# Patient Record
Sex: Female | Born: 1960 | Race: White | Hispanic: No | Marital: Married | Smoking: Never smoker
Health system: Southern US, Community
[De-identification: ages and names within clinical notes are randomized; demographics above are authoritative.]

## PROBLEM LIST (undated history)

## (undated) DIAGNOSIS — T7840XA Allergy, unspecified, initial encounter: Secondary | ICD-10-CM

## (undated) DIAGNOSIS — N800 Endometriosis of uterus: Secondary | ICD-10-CM

## (undated) DIAGNOSIS — R519 Headache, unspecified: Secondary | ICD-10-CM

## (undated) DIAGNOSIS — G8929 Other chronic pain: Secondary | ICD-10-CM

## (undated) DIAGNOSIS — R51 Headache: Secondary | ICD-10-CM

## (undated) HISTORY — PX: WRIST SURGERY: SHX841

## (undated) HISTORY — DX: Headache, unspecified: R51.9

## (undated) HISTORY — DX: Allergy, unspecified, initial encounter: T78.40XA

## (undated) HISTORY — DX: Endometriosis of uterus: N80.0

## (undated) HISTORY — DX: Headache: R51

## (undated) HISTORY — DX: Other chronic pain: G89.29

---
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
## 1990-01-10 DIAGNOSIS — N8003 Adenomyosis of the uterus: Secondary | ICD-10-CM

## 1990-01-10 DIAGNOSIS — N800 Endometriosis of uterus: Secondary | ICD-10-CM

## 1990-01-10 HISTORY — DX: Adenomyosis of the uterus: N80.03

## 1990-01-10 HISTORY — DX: Endometriosis of uterus: N80.0

## 1990-01-10 HISTORY — PX: LAPAROSCOPIC ENDOMETRIOSIS FULGURATION: SUR769

## 1998-01-10 HISTORY — PX: MOUTH SURGERY: SHX715

## 2004-01-11 HISTORY — PX: ENDOMETRIAL ABLATION: SHX621

## 2009-01-10 HISTORY — PX: APPENDECTOMY: SHX54

## 2010-04-12 ENCOUNTER — Other Ambulatory Visit: Payer: Self-pay | Admitting: Gastroenterology

## 2010-04-13 ENCOUNTER — Ambulatory Visit
Admission: RE | Admit: 2010-04-13 | Discharge: 2010-04-13 | Disposition: A | Discharge status: Home / Self Care | Payer: 59 | Source: Ambulatory Visit | Attending: Gastroenterology | Admitting: Gastroenterology

## 2010-04-13 ENCOUNTER — Other Ambulatory Visit: Payer: Self-pay | Admitting: Gastroenterology

## 2010-04-13 DIAGNOSIS — R52 Pain, unspecified: Secondary | ICD-10-CM

## 2010-04-13 MED ORDER — IOHEXOL 300 MG/ML  SOLN
100.0000 mL | Freq: Once | INTRAMUSCULAR | Status: AC | PRN
  Administered 2010-04-13: 100 mL via INTRAVENOUS

## 2010-04-14 ENCOUNTER — Other Ambulatory Visit: Payer: Self-pay | Admitting: General Surgery

## 2010-04-14 ENCOUNTER — Observation Stay (HOSPITAL_COMMUNITY)
Admission: AD | Admit: 2010-04-14 | Discharge: 2010-04-15 | Disposition: A | Discharge status: Home / Self Care | Payer: 59 | Source: Ambulatory Visit | Attending: General Surgery | Admitting: General Surgery

## 2010-04-14 ENCOUNTER — Emergency Department (HOSPITAL_COMMUNITY): Admission: EM | Admit: 2010-04-14 | Payer: 59 | Source: Home / Self Care

## 2010-04-14 DIAGNOSIS — R1031 Right lower quadrant pain: Secondary | ICD-10-CM | POA: Insufficient documentation

## 2010-04-14 DIAGNOSIS — N805 Endometriosis of intestine, unspecified: Secondary | ICD-10-CM | POA: Insufficient documentation

## 2010-04-14 DIAGNOSIS — D126 Benign neoplasm of colon, unspecified: Principal | ICD-10-CM | POA: Insufficient documentation

## 2010-04-14 HISTORY — PX: COLON SURGERY: SHX602

## 2010-04-26 NOTE — Op Note (Signed)
[name], Carmen                ACCOUNT NO.:  [id number]  MEDICAL RECORD NO.:  [id number]           PATIENT TYPE:  O  LOCATION:  1525                         FACILITY:  [facility]  PHYSICIAN:  Anselm Pancoast. 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  OPERATION:  Appendectomy and then I did a little limited excision of the base of the appendix after pathology examination by Dr. Laureen Ochs.  HISTORY OF PRESENT ILLNESS:  Carmen Wu is a 50 year old female who was referred by Dr. Kinnie Scales for about a 2-week history of kind of progressing pain in the right lower quadrant.  No nausea or vomiting, but she saw Dr. Kinnie Scales on Monday.  Had a CT done yesterday that showed what was thought to be an inflammatory area behind the cecum with no obvious stranding and not anything that looked like a ruptured appendicitis, but certainly concerning of appendicitis.  She has a past history of endometriosis, but has had no symptoms recently.  Her last colonoscopy was approximately 2 years ago.  I discussed with her that I would recommended that we go ahead and proceed with kind of an open appendectomy and she is in agreement with that.  She is still tender in the right lower quadrant, but she is not febrile.  PROCEDURE IN DETAIL:  She was given Zosyn 3.375 grams IV.  She has PAS stockings.  She had voided before going to the OR and was taken to the OR.  Induction of general anesthesia.  Dr. Okey Dupre was the anesthesiologist.  Endotracheal tube was placed.  The abdomen was prepped with Betadine solution and I made a little small incision in the right lower quadrant below the iliac crest.  The subcutaneous tissue was opened as was Scarpa's fascia.  The external  oblique was opened in the directions of its fibers and then the lateral edge of the rectus muscle was split.  The underlying peritoneum was opened and then the underlying posterior rectus, external, internal oblique and then the posterior or the peritoneal opened.  The cecum was right under the area.  You could run your finger in and feel a little mass behind the cecum to appendiceal and a little goulet were used to kind of retraction by the scrub nurse and then I could visualize the area and it is cystic- appearing.  It looks like a little short nubble of appendix, but there is no evidence of any acute inflammation.  The area is a little bit more tissue than the inflammatory component and I mobilized the cecum so I could bring it up into the wound.  There was a little cystic structure that, with manipulating it, that popped.  We are talking about a quarter of an inch-type area and I went ahead and could visualize the base of the appendix.  The appendix itself, if it is an inflammatory process, is kind of the tip of a little short  squabby appendix.  I freed up the base from its attachment where it was laying in the retrocecal area and could free up the base, encompassed it with a 2-0 Vicryl and then tied it. The little area where the area was stuck to the posterior portion of the cecum, I kind of closed that with some Lembert sutures of 3-0 silk and then, of course, the structure had been removed.  I then put a pursestring suture on the base of the appendix.  I sent this over and let Dr. Laureen Ochs examine it and he said that this is a mucinous something or other.  He hopes it is not a malignancy, but it would take a lot of looking to be sure that it is not and that they really cannot call these things on frozen examination.  I discussed with him that I could go ahead and remove the base of the appendix, the kind of little half-inch segment of the cecum since I could bring it up and put it under  a TA-30 stapler and he said that he would recommend going ahead and doing that. I freed up the little mesentery around it and used the TA-30 with the thicker staples, fired it and then used a knife to cut it.  I then used Lembert 3-0 silk sutures to kind of turn in the suture line, making sure that the ileocecal valve area was not compromised.  The bleeding was well-controlled.  I could not feel any other nodularities or other problems and it looks like we got real good results with the enemas and she is not constipated like she was before the enemas.  The incision was closed with a running 2-0 Vicryl on the peritoneum and transversalis and internal oblique that is a little 2-inch incision and then another running layers of 2-0 Vicryl on the external oblique, a few interrupted Vicryls on the Scarpa's fascia, 4-0 Dexon and 3 half-inch Steri-Strips. I talked with the patient's husband that it is going to be 2 or 3 days before we have a final pathology report and hopefully this is not going to be a malignancy that would require a right colectomy.  The patient has not been notified of our findings yet, as she is waking up in therecovery room.  Sponge and needle counts were correct.  Estimated blood loss was minimal.  I am going to plan on giving her another dose of antibiotics postoperatively and she is spending the night.     Anselm Pancoast. Zachery Dakins, M.D.     WJW/MEDQ  D:  04/14/2010  T:  04/15/2010  Job:  [id number]  cc:   Griffith Citron, M.D. Fax: [phone]  Anselm Pancoast. Zachery Dakins, M.D. [address] [place] [address]  Electronically Signed by Consuello Bossier M.D. on 04/26/2010 08:54:40 AM

## 2010-04-26 NOTE — H&P (Signed)
Carmen Wu, Carmen Wu                ACCOUNT NO.:  [id number]  MEDICAL RECORD NO.:  [id number]           PATIENT TYPE:  O  LOCATION:  1525                         FACILITY:  [facility]  PHYSICIAN:  Anselm Pancoast. Fatemah Pourciau, M.D.DATE OF BIRTH:  1960/02/28  DATE OF ADMISSION:  04/14/2010 DATE OF DISCHARGE:                             HISTORY & PHYSICAL   CHIEF COMPLAINT:  Abdominal pain of approximately two weeks' duration right lower quadrant.  HISTORY:  Carmen Wu is a 50 year old female who was seeing Dr. Kinnie Scales for abdominal pain of approximately 10 days or so duration.  She has kind of a chronic history of poor bowel movements.  Had a colonoscopy about 2 years ago and saw him recently because of this pain, which she described as always being in the right lower quadrant.  No fever.  No significant nausea and vomiting and et Karie Soda.  He saw her and did laboratory studies, which were unremarkable and then ordered a CT.  The CT was read as showing a mass located behind the cecum that was thought to be probably a chronic appendicitis.  I called the patient yesterday and she was at work and said that she was not having any acute symptoms and et Karie Soda and she described the pain as starting about a week ago and that she would have episodes of pain in the right lower quadrant. It kind of felt like it was a significant pressure issue and then because of this she saw Dr. Kinnie Scales.  I recommended that we place her n.p.o. after midnight and let her come to the emergency room early this morning so I could examine her.  She came to the emergency room, had laboratories drawn and it was from spectrum and the hematocrit was normal as was the white count, similar to what Dr. Jennye Boroughs 2 days earlier.  I took her over to x-ray and reviewed the CT with her and showed her this little thickening located right at the cecal area and showed her how constipated she is chronically.  She, on physical examination, is  definitely still tender in the right lower quadrant, kind of with involuntary muscle guarding.  She is not tender in the other areas.  I discussed with her that I would, because of the tenderness, go ahead and admit her, give her a soapsuds enema and unless her symptoms actually subside with the enema, would consider doing an appendectomy.  Whether it is done open or laparoscopic, I think with the mass being down in the right lower quadrant and possibly and probably retrocecally, that a little open appendectomy might be the better way of managing this since she is quite thin.  She was in agreement with this, did have the enema with good results, but did not improve her pain and is in agreement for an appendectomy.  PAST MEDICAL HISTORY:  Years ago she has had a laparotomy through a Pfannenstiel incision for endometriosis.  She has also had a uterine ablation several years ago for heavy menstrual bleeding and has not had further problems with endometriosis that she is aware of.  Her colonoscopy has been  unremarkable.  She has had wrist surgeries.  She works as a Emergency planning/management officer for one of [employer] and is quite physically active.  She says she drinks a large amount of fluids, but she has always had kind of a lifelong history of poor bowel function. On the CT there was no evidence of any diverticulosis that I could see.  ALLERGIES:  She is not allergic to any chronic medications.  MEDICATIONS:  As far as chronic medications, I do not think that she takes anything of any significance.  She is on herbal supplement, multivitamins, vitamin D over-the-counter and estradiol kind of estrogen replacement.  FAMILY HISTORY:  The family history is noncontributory.  Somebody in her family has had a problem with colon cancer, but it is distant.  PHYSICAL EXAMINATION:  VITAL SIGNS:  Pulse was 74. EYES, EARS, NOSE AND THROAT:  Unremarkable. LUNGS:  Clear. CARDIAC:  Normal sinus  rhythm. ABDOMEN:  She is not tender in the upper abdomen.  She is locally tender in the right lower quadrant, sort of like an early appendicitis. GENITOURINARY/RECTAL:  I did not do a rectal and pelvic examination on her.  No urinary-type symptoms. EXTREMITIES:  No pedal edema. BACK:  No back issues.  ADMISSION IMPRESSION:  Mass, kind of retrocecal, probably kind of chronic appendicitis.  PLAN:  Plan on an open appendectomy later today.  EKG has been ordered.     Anselm Pancoast. Zachery Dakins, M.D.     WJW/MEDQ  D:  04/14/2010  T:  04/15/2010  Job:  [id number]  Electronically Signed by Consuello Bossier M.D. on 04/26/2010 08:54:28 AM

## 2010-05-25 NOTE — Discharge Summary (Signed)
[name], [name]                ACCOUNT NO.:  [id number]  MEDICAL RECORD NO.:  [id number]           PATIENT TYPE:  O  LOCATION:  1525                         FACILITY:  [facility]  PHYSICIAN:  Anselm Pancoast. Isami Mehra, M.D.DATE OF BIRTH:  04-29-1960  DATE OF ADMISSION:  04/15/2010 DATE OF DISCHARGE:  04/16/2010                              DISCHARGE SUMMARY   PROCEDURE:  She had an open appendectomy.  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  HISTORY:  Carmen Wu is a 50 year old Caucasian female who was seen by Dr. Ritta Slot in his office approximately 2 days prior to her hospitalization with complaints of kind of cramping abdominal pain and poor bowel function and 2 years ago had had a colonoscopy by Dr. Kinnie Scales and no definite abnormalities were noted.  She describes episodes of pain usually in the right lower quadrant.  She does not have fever.  She does not have significant nausea or vomiting, but she presented these symptoms to him.  He did laboratory studies and her white count was normal and scheduled a CT that was performed ordered the next day.  The radiologist called Dr. Kinnie Scales and there appears to be a thickening around the area of her appendix and could this be a chronic appendicitis of 4 or 5 days' duration.  I called the patient when he called me and she said she was at work and was not having any significant pain that was unusual and arrangements were made for her to come to the emergency room first thing the following morning and to be n.p.o. after midnight, as most likely she was going to need an appendectomy.  She returned as instructed the following morning at about 7 o'clock in an n.p.o. status and on examination she was afebrile.  She was not tender in the upper abdomen.  She was very locally tender in the right lower quadrant.  She is a very thin individual and the findings were consistent with kind of an early  appendicitis.  I reviewed the CT with the radiologist.  We could not see anything that was obviously an abscess, but the area appeared to be kind of a retrocecal and I recommended to her that we proceed on with an open appendectomy through a small incision instead of trying to do it laparoscopically.  She was in agreement with this and was taken to the operating room later that day and through a very small incision I was able to kind of rotate the cecum up and found an area of thickening about the size of my index finger, only about an inch and a half long, but there was kind of a cystic lesion in the distal portion that did not look like an obviously acute appendicitis, but there was definitely a mucinous-appearing lesion.  Fortunately this was in the afternoon and the pathologists were still available and I removed the appendix and sent it down for Dr. Laureen Ochs to review to see if he could tell us exactly what we were dealing with and he said he would rather not do a frozen.  It looks  like it is a mucinous lesion and he was wondering if I could go ahead and I recommended that we go ahead and excise just a little base of the appendix cecum where I had inverted it to see if there was any similar findings there.  Over the next 2 or 3 days the pathologists were studying this and trying to decide whether it was a mucinous neoplasm.  They did not think it was really a straightforward acute appendicitis.  She has had a history of endometriosis and there was definitely endometriosis in this periappendiceal mass.  The patient, however, the following morning was doing fine.  Her diet had been started.  Her incision looked good and she desired to be discharged since this was over Easter weekend and obviously nothing further was going to be done as far as studying the tumor and she was eating fine. She will be seen in the office the following week for a wound check.  I did not discharge her on  antibiotics and she was given about two doses of antibiotics in the perioperative.  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  There was nothing else surrounding the area that I could see and we are going to put her on chronic MiraLax as she was significantly constipated when she had the CT and obviously has a history of poor bowel function, even though she says that she does have a bowel movement daily.     Anselm Pancoast. Zachery Dakins, M.D.     WJW/MEDQ  D:  05/13/2010  T:  05/13/2010  Job:  [id number]  cc:   Anselm Pancoast. Zachery Dakins, M.D. [address], [address] [place] [address]  Griffith Citron, M.D. Fax: [phone]  Electronically Signed by Consuello Bossier M.D. on 05/25/2010 09:10:39 AM

## 2010-06-25 ENCOUNTER — Other Ambulatory Visit (INDEPENDENT_AMBULATORY_CARE_PROVIDER_SITE_OTHER): Payer: Self-pay | Admitting: General Surgery

## 2010-06-29 ENCOUNTER — Encounter (INDEPENDENT_AMBULATORY_CARE_PROVIDER_SITE_OTHER): Payer: Self-pay | Admitting: General Surgery

## 2010-06-29 DIAGNOSIS — R109 Unspecified abdominal pain: Secondary | ICD-10-CM | POA: Insufficient documentation

## 2010-06-29 DIAGNOSIS — J349 Unspecified disorder of nose and nasal sinuses: Secondary | ICD-10-CM | POA: Insufficient documentation

## 2010-06-29 DIAGNOSIS — Z973 Presence of spectacles and contact lenses: Secondary | ICD-10-CM

## 2010-06-29 DIAGNOSIS — R040 Epistaxis: Secondary | ICD-10-CM | POA: Insufficient documentation

## 2010-10-01 ENCOUNTER — Ambulatory Visit (INDEPENDENT_AMBULATORY_CARE_PROVIDER_SITE_OTHER): Payer: 59 | Admitting: Internal Medicine

## 2010-10-01 ENCOUNTER — Encounter: Payer: Self-pay | Admitting: Internal Medicine

## 2010-10-01 DIAGNOSIS — R87619 Unspecified abnormal cytological findings in specimens from cervix uteri: Secondary | ICD-10-CM

## 2010-10-01 DIAGNOSIS — E162 Hypoglycemia, unspecified: Secondary | ICD-10-CM

## 2010-10-01 DIAGNOSIS — Z13220 Encounter for screening for lipoid disorders: Secondary | ICD-10-CM

## 2010-10-01 DIAGNOSIS — M899 Disorder of bone, unspecified: Secondary | ICD-10-CM

## 2010-10-01 DIAGNOSIS — R5381 Other malaise: Secondary | ICD-10-CM

## 2010-10-01 DIAGNOSIS — R5383 Other fatigue: Secondary | ICD-10-CM

## 2010-10-01 DIAGNOSIS — M8580 Other specified disorders of bone density and structure, unspecified site: Secondary | ICD-10-CM | POA: Insufficient documentation

## 2010-10-01 DIAGNOSIS — M949 Disorder of cartilage, unspecified: Secondary | ICD-10-CM

## 2010-10-01 NOTE — Patient Instructions (Signed)
Your goal is 1200 mg calcium and 1000 units of Vit D daily.  Gaol is 25 minutes of weight bearing exercise 5 days/week

## 2010-10-01 NOTE — Progress Notes (Signed)
  Subjective:    Patient ID: Carmen Wu, female    DOB: 03-20-1960, 50 y.o.   MRN: [id number]  HPI  50 yo white female with complicated surgical history , medical history includes recurrent headaches, presents for establishment of primary care.  No chief complaint today.     Review of Systems  Constitutional: Negative for fever, chills and unexpected weight change.  HENT: Negative for hearing loss, ear pain, nosebleeds, congestion, sore throat, facial swelling, rhinorrhea, sneezing, mouth sores, trouble swallowing, neck pain, neck stiffness, voice change, postnasal drip, sinus pressure, tinnitus and ear discharge.   Eyes: Negative for pain, discharge, redness and visual disturbance.  Respiratory: Negative for cough, chest tightness, shortness of breath, wheezing and stridor.   Cardiovascular: Negative for chest pain, palpitations and leg swelling.  Musculoskeletal: Negative for myalgias and arthralgias.  Skin: Negative for color change and rash.  Neurological: Negative for dizziness, weakness, light-headedness and headaches.  Hematological: Negative for adenopathy.       Objective:   Physical Exam  Constitutional: She is oriented to person, place, and time. She appears well-developed and well-nourished.  HENT:  Mouth/Throat: Oropharynx is clear and moist.  Eyes: EOM are normal. Pupils are equal, round, and reactive to light. No scleral icterus.  Neck: Normal range of motion. Neck supple. No JVD present. No thyromegaly present.  Cardiovascular: Normal rate, regular rhythm, normal heart sounds and intact distal pulses.   Pulmonary/Chest: Effort normal and breath sounds normal.  Abdominal: Soft. Bowel sounds are normal. She exhibits no mass. There is no tenderness.  Musculoskeletal: Normal range of motion. She exhibits no edema.  Lymphadenopathy:    She has no cervical adenopathy.  Neurological: She is alert and oriented to person, place, and time.  Skin: Skin is warm and dry.    Psychiatric: She has a normal mood and affect.          Assessment & Plan:

## 2010-10-03 ENCOUNTER — Encounter: Payer: Self-pay | Admitting: Internal Medicine

## 2010-10-03 DIAGNOSIS — N8003 Adenomyosis of the uterus: Secondary | ICD-10-CM | POA: Insufficient documentation

## 2010-10-03 DIAGNOSIS — G8929 Other chronic pain: Secondary | ICD-10-CM | POA: Insufficient documentation

## 2010-10-03 DIAGNOSIS — N800 Endometriosis of uterus: Secondary | ICD-10-CM | POA: Insufficient documentation

## 2010-10-03 NOTE — Assessment & Plan Note (Addendum)
Her T scores have been stable per patient.  Records requested.  Reviewed calcium and vitamin d requirements and need for daily weight bearing exercise.

## 2010-10-03 NOTE — Assessment & Plan Note (Signed)
Per patient.  No documentation of true hypoglycemia (cbgs < 60).  Discussed diet,  Need to combine sugars with protein.  Will screen for diabetes.

## 2010-10-27 ENCOUNTER — Ambulatory Visit
Admission: RE | Admit: 2010-10-27 | Discharge: 2010-10-27 | Disposition: A | Discharge status: Home / Self Care | Payer: 59 | Source: Ambulatory Visit | Attending: General Surgery | Admitting: General Surgery

## 2010-10-27 MED ORDER — IOHEXOL 300 MG/ML  SOLN
100.0000 mL | Freq: Once | INTRAMUSCULAR | Status: AC | PRN
  Administered 2010-10-27: 100 mL via INTRAVENOUS

## 2010-11-17 ENCOUNTER — Telehealth (INDEPENDENT_AMBULATORY_CARE_PROVIDER_SITE_OTHER): Payer: Self-pay

## 2010-11-17 NOTE — Telephone Encounter (Signed)
Ct results given to patient also copy mailed. Patient will follow up with primary care provider. She will  Call Dr. Zachery Dakins if the need should arise.

## 2010-12-17 ENCOUNTER — Ambulatory Visit (INDEPENDENT_AMBULATORY_CARE_PROVIDER_SITE_OTHER): Payer: 59 | Admitting: General Surgery

## 2010-12-17 ENCOUNTER — Encounter (INDEPENDENT_AMBULATORY_CARE_PROVIDER_SITE_OTHER): Payer: Self-pay | Admitting: General Surgery

## 2010-12-17 VITALS — BP 120/70 | HR 62 | Temp 97.2°F | Resp 14 | Ht 65.75 in | Wt 134.8 lb

## 2010-12-17 DIAGNOSIS — K388 Other specified diseases of appendix: Secondary | ICD-10-CM

## 2010-12-17 DIAGNOSIS — K5904 Chronic idiopathic constipation: Secondary | ICD-10-CM

## 2010-12-17 DIAGNOSIS — K389 Disease of appendix, unspecified: Secondary | ICD-10-CM

## 2010-12-17 DIAGNOSIS — K5909 Other constipation: Secondary | ICD-10-CM

## 2010-12-17 NOTE — Patient Instructions (Signed)
Continue take a MiraLax and trying to keep your colon functionregular. If you continue to have nausea after eating a gall bladder ultrasound may be needed Continue regular appointment with Dr. Jennette Kettle. One of my partners could see you if you desire

## 2010-12-17 NOTE — Progress Notes (Signed)
Subjective:     Patient ID: Carmen Wu, female   DOB: Nov 24, 1960, 50 y.o.   MRN: [id number]  HPIMrs. Winfield Wu returns now for proximal 8 month following an open appendectomy that I performed after she had seen Dr. Kinnie Scales with right-sided abdominal pain of several weeks duration. He didn't say today and it showed a lesion about 3 cm behind her cecum that was thought to be a chronically inflamed appendix and since it was retrocecal and she's. Then I recommended that we do an open appendectomy and did send it for frozen exam and Dr. Darlyn Chamber the pathologist thought that this was a mucus neoplasm and while I was doing the appendectomy I did today for excision of the base of the cecum but there was no evidence of any tumor or abnormality in this portion of the colon. The patient is aware that this is a low-grade malignant potential mucinous  neoplasm and I have suggested that we do a followup CT at this time which was done recently and is showing no evidence of any abnormalities in the area around the cecum. She has had a lifelong history of chronic constipation and had originally suggested that she take MiraLax on a daily basis she says she didn't her bowels work better than the overhead she's been trying to get off of that but states that this is  causing some problems in regularity and she has resumed the miralax.        She has had her routine physical exam yearly with Dr. Jennette Kettle and he found no abnormalities on pelvic exam her CT shows that she does have a fibroid but there is no area of questionable areas within the pelvic   Review of Systems   Current Outpatient Prescriptions  Medication Sig Dispense Refill  . BIOTIN 5000 PO Take 1 tablet by mouth daily.        . calcium-vitamin D 250-100 MG-UNIT per tablet Take 1 tablet by mouth 2 (two) times daily. Ask patient to clarify dosage.       . drospirenone-ethinyl estradiol (YAZ) 3-0.02 MG per tablet Take 1 tablet by mouth daily. Ask patient to clarify dosage.        . minoxidil (ROGAINE) 2 % external solution Apply topically 2 (two) times daily.        . Multiple Vitamin (MULTIVITAMIN) capsule Take 1 capsule by mouth daily. Ask patient to clarify dosage. Also ask patient about herbal supplement Quercetin per medical history form. Not listed in database.       . polyethylene glycol (MIRALAX / GLYCOLAX) packet Take 17 g by mouth daily.        Marland Kitchen QUERCETIN PO Take 1 tablet by mouth daily.        Marland Kitchen tretinoin (RETIN-A) 0.025 % cream Apply topically once a week.         Past Surgical History  Procedure Date  . Tonsillectomy and adenoidectomy 1966  . Wrist surgery 1999 (left ) & 2003 (right)  . Laparoscopic endometriosis fulguration 1992    Confirm procedure type with patient.  . Endometrial ablation 2006  . Mouth surgery 2000    Confirm type of surgery with patient.  . Sublingual cyst excision 1968  . Appendectomy 2011  . Colon surgery 04/14/2010    Segmental resection, portion of cecum and appendix base.   No Known Allergies      Objective:   Physical Exam BP 120/70  Pulse 62  Temp(Src) 97.2 F (36.2 C) (Temporal)  Resp  14  Ht 5' 5.75" (1.67 m)  Wt 134 lb 12.8 oz (61.145 kg)  BMI 21.92 kg/m2  On abdominal exam she's got a very flat soft abdomen normal bowel sounds and a well-healed small appendectomy incision in the right lower quadrant U. contrast and the anterior right lower abdomen there's no question of any fullness and on rectal examination and examination through her posterior vagina and a soft movable movable and no evidence of any questionable masses in this area. She has had a recent colonoscopy about 2 years ago by Dr. Kinnie Scales and I think her next colonoscopy is scheduled in approximately 3 years.    Assessment:    Thank the patient is 8 months following an extended appendectomy for a low-grade mucinous of the appendix. She has a chronic history of constipation but a recent CT of her abdomen and pelvis shows no evidence of any  questionable lesions in the lower abdomen and I would not continue with frequent CTs but I would think that a careful pelvic and abdominal exam when she has her routine physical exam he is need. At present she is having some postprandial upper abdominal pain that couldn't be gallbladder in and if that persists and she will need an ultrasound of the gallbladder. She's remove her primary care to Dr. Darrick Huntsman with a [facility] in [place]. Dr. Laureen Ochs pathologist felt this has a very low chance of recurrence but he is definitely a very low grade malignancy and not a benign lesion. The patient states that she was having vague symptoms in the right lower quadrant for proximal one over more years prior to the 2-3 day episode of pain that was getting worse when she saw Dr. Kinnie Scales     Plan:     Patient is not scheduled to be seen by one of my partner that if she is having increasing symptoms they can see her if needed. The patient has copies of her recent CT scan and a copy of her pathology report.   The patient has a future ordered CBC C. met thyroid hormone and lipid panel but I can see exactly when this is to be performed. She does have a followup with Dr. Darrick Huntsman and I expect the labs are due to prior to that visit

## 2011-02-15 ENCOUNTER — Encounter: Payer: Self-pay | Admitting: Internal Medicine

## 2011-02-15 ENCOUNTER — Ambulatory Visit (INDEPENDENT_AMBULATORY_CARE_PROVIDER_SITE_OTHER): Payer: 59 | Admitting: Internal Medicine

## 2011-02-15 DIAGNOSIS — R51 Headache: Secondary | ICD-10-CM

## 2011-02-15 DIAGNOSIS — R519 Headache, unspecified: Secondary | ICD-10-CM | POA: Insufficient documentation

## 2011-02-15 MED ORDER — AMOXICILLIN-POT CLAVULANATE 875-125 MG PO TABS: 1.0000 | ORAL_TABLET | Freq: Two times a day (BID) | ORAL | Status: AC

## 2011-02-15 MED ORDER — PREDNISONE (PAK) 10 MG PO TABS: ORAL_TABLET | ORAL | Status: AC

## 2011-02-15 NOTE — Patient Instructions (Addendum)
We are going to to treat your for a sinus infection for 10 days to see if symptoms resolve with:  augmentin twice daily for 10 days Prednisone taper x 6 days  Mucinex D in the am,  Afrin nasal spray at bedtime  Please use Simply Saline nasal spray twice daily to hydrate and flush your sinuses  If no improvement,  Call and we will schedule an MRI of brain

## 2011-02-15 NOTE — Assessment & Plan Note (Signed)
Given her improvement with Sudafed and the appearance of her ears, will treat for chronic sinusitis with a 10 day course of Augmentin and prednisone. If there is no improvement in her headache better she will need an MRI as this has not been done in the past and this is a change in her headache pattern.

## 2011-02-15 NOTE — Progress Notes (Signed)
Subjective:    Patient ID: Carmen Wu, female    DOB: November 17, 1960, 51 y.o.   MRN: [id number]  HPI  Ms. weed is a 51 year old white female with a history of chronic headaches who presents today with a history of 2 solid months of daily headaches. She states the headaches have improved transiently with use of Mucinex D. and Zyrtec D. She is also noted some improvement with Excedrin Migraine. Her pain is located behind the right eye and are throbbing in nature. She has noted some sinus congestion and drainage which is clear to yellow in quality. She has had no vision changes or vertigo. No morning nausea. The headaches are not present when she wakes up .  Past Medical History  Diagnosis Date  . Allergy     now managed with daily Quercetin with bromelade  . Endometriosis of myometrium 1992  . Chronic headaches     Current Outpatient Prescriptions on File Prior to Visit  Medication Sig Dispense Refill  . BIOTIN 5000 PO Take 1 tablet by mouth daily.        . calcium-vitamin D 250-100 MG-UNIT per tablet Take 1 tablet by mouth 2 (two) times daily. Ask patient to clarify dosage.       . drospirenone-ethinyl estradiol (YAZ) 3-0.02 MG per tablet Take 1 tablet by mouth daily. Ask patient to clarify dosage.       . minoxidil (ROGAINE) 2 % external solution Apply topically 2 (two) times daily.        . Multiple Vitamin (MULTIVITAMIN) capsule Take 1 capsule by mouth daily. Ask patient to clarify dosage. Also ask patient about herbal supplement Quercetin per medical history form. Not listed in database.       . polyethylene glycol (MIRALAX / GLYCOLAX) packet Take 17 g by mouth daily.        Marland Kitchen QUERCETIN PO Take 1 tablet by mouth daily.        Marland Kitchen tretinoin (RETIN-A) 0.025 % cream Apply topically once a week.          Review of Systems  Constitutional: Negative for fever, chills and unexpected weight change.  HENT: Negative for hearing loss, ear pain, nosebleeds, congestion, sore throat, facial swelling,  rhinorrhea, sneezing, mouth sores, trouble swallowing, neck pain, neck stiffness, voice change, postnasal drip, sinus pressure, tinnitus and ear discharge.   Eyes: Negative for pain, discharge, redness and visual disturbance.  Respiratory: Negative for cough, chest tightness, shortness of breath, wheezing and stridor.   Cardiovascular: Negative for chest pain, palpitations and leg swelling.  Musculoskeletal: Negative for myalgias and arthralgias.  Skin: Negative for color change and rash.  Neurological: Negative for dizziness, weakness, light-headedness and headaches.  Hematological: Negative for adenopathy.   BP 114/68  Pulse 84  Temp(Src) 98.2 F (36.8 C) (Oral)  Wt 134 lb (60.782 kg)  SpO2 98%     Objective:   Physical Exam  Constitutional: She is oriented to person, place, and time. She appears well-developed and well-nourished.  HENT:  Right Ear: There is swelling.  Left Ear: There is swelling. Tympanic membrane is bulging.  Mouth/Throat: Oropharynx is clear and moist.  Eyes: EOM are normal. Pupils are equal, round, and reactive to light. No scleral icterus.  Neck: Normal range of motion. Neck supple. No JVD present. No thyromegaly present.  Cardiovascular: Normal rate, regular rhythm, normal heart sounds and intact distal pulses.   Pulmonary/Chest: Effort normal and breath sounds normal.  Abdominal: Soft. Bowel sounds are normal. She exhibits no  mass. There is no tenderness.  Musculoskeletal: Normal range of motion. She exhibits no edema.  Lymphadenopathy:    She has no cervical adenopathy.  Neurological: She is alert and oriented to person, place, and time.  Skin: Skin is warm and dry.  Psychiatric: She has a normal mood and affect.       Assessment & Plan:   Headache behind the eyes Given her improvement with Sudafed and the appearance of her ears, will treat for chronic sinusitis with a 10 day course of Augmentin and prednisone. If there is no improvement in her  headache better she will need an MRI as this has not been done in the past and this is a change in her headache pattern.    Updated Medication List Outpatient Encounter Prescriptions as of 02/15/2011  Medication Sig Dispense Refill  . BIOTIN 5000 PO Take 1 tablet by mouth daily.        . calcium-vitamin D 250-100 MG-UNIT per tablet Take 1 tablet by mouth 2 (two) times daily. Ask patient to clarify dosage.       . drospirenone-ethinyl estradiol (YAZ) 3-0.02 MG per tablet Take 1 tablet by mouth daily. Ask patient to clarify dosage.       . minoxidil (ROGAINE) 2 % external solution Apply topically 2 (two) times daily.        . Multiple Vitamin (MULTIVITAMIN) capsule Take 1 capsule by mouth daily. Ask patient to clarify dosage. Also ask patient about herbal supplement Quercetin per medical history form. Not listed in database.       . polyethylene glycol (MIRALAX / GLYCOLAX) packet Take 17 g by mouth daily.        Marland Kitchen QUERCETIN PO Take 1 tablet by mouth daily.        Marland Kitchen tretinoin (RETIN-A) 0.025 % cream Apply topically once a week.        Marland Kitchen amoxicillin-clavulanate (AUGMENTIN) 875-125 MG per tablet Take 1 tablet by mouth 2 (two) times daily.  20 tablet  0  . predniSONE (STERAPRED UNI-PAK) 10 MG tablet 6 tablets on day 1, decrease by tablet daily until gone  21 tablet  0

## 2011-02-18 ENCOUNTER — Other Ambulatory Visit (INDEPENDENT_AMBULATORY_CARE_PROVIDER_SITE_OTHER): Payer: 59 | Admitting: *Deleted

## 2011-02-18 DIAGNOSIS — R5383 Other fatigue: Secondary | ICD-10-CM

## 2011-02-18 DIAGNOSIS — M8580 Other specified disorders of bone density and structure, unspecified site: Secondary | ICD-10-CM

## 2011-02-18 DIAGNOSIS — M949 Disorder of cartilage, unspecified: Secondary | ICD-10-CM

## 2011-02-18 DIAGNOSIS — R5381 Other malaise: Secondary | ICD-10-CM

## 2011-02-18 DIAGNOSIS — Z13220 Encounter for screening for lipoid disorders: Secondary | ICD-10-CM

## 2011-02-18 DIAGNOSIS — E162 Hypoglycemia, unspecified: Secondary | ICD-10-CM

## 2011-02-18 DIAGNOSIS — M899 Disorder of bone, unspecified: Secondary | ICD-10-CM

## 2011-02-18 LAB — COMPREHENSIVE METABOLIC PANEL
ALT: 27 U/L (ref 0–35)
AST: 21 U/L (ref 0–37)
Calcium: 9.4 mg/dL (ref 8.4–10.5)
Chloride: 106 mEq/L (ref 96–112)
Creatinine, Ser: 0.7 mg/dL (ref 0.4–1.2)
Potassium: 4.9 mEq/L (ref 3.5–5.1)

## 2011-02-18 LAB — CBC WITH DIFFERENTIAL/PLATELET
Basophils Absolute: 0 10*3/uL (ref 0.0–0.1)
Eosinophils Absolute: 0 10*3/uL (ref 0.0–0.7)
HCT: 35.3 % — ABNORMAL LOW (ref 36.0–46.0)
Hemoglobin: 11.8 g/dL — ABNORMAL LOW (ref 12.0–15.0)
Lymphocytes Relative: 17.2 % (ref 12.0–46.0)
Lymphs Abs: 1.7 10*3/uL (ref 0.7–4.0)
MCHC: 33.2 g/dL (ref 30.0–36.0)
Monocytes Absolute: 1 10*3/uL (ref 0.1–1.0)
Neutro Abs: 7.3 10*3/uL (ref 1.4–7.7)
RDW: 13.3 % (ref 11.5–14.6)

## 2011-02-18 LAB — LIPID PANEL
LDL Cholesterol: 103 mg/dL — ABNORMAL HIGH (ref 0–99)
Total CHOL/HDL Ratio: 3
Triglycerides: 67 mg/dL (ref 0.0–149.0)

## 2011-02-24 ENCOUNTER — Telehealth: Payer: Self-pay | Admitting: *Deleted

## 2011-02-24 ENCOUNTER — Encounter: Payer: Self-pay | Admitting: Internal Medicine

## 2011-02-24 NOTE — Telephone Encounter (Signed)
Pt states she has that started last night, a fine red rash with slight itching.  She's taking amox and has finished the prednisone.  Sinus symptoms are better and her headaches are gone.  She is due to finish amox on Saturday.

## 2011-02-24 NOTE — Telephone Encounter (Signed)
See phone note

## 2011-02-24 NOTE — Telephone Encounter (Signed)
Tell; her to stop the amoxicillin and i will add that as an allergy. If her sympotms are resovled she does not need another abx

## 2011-02-24 NOTE — Telephone Encounter (Signed)
Advised patient

## 2011-02-24 NOTE — Telephone Encounter (Signed)
Left message for patient to call back  

## 2011-02-25 NOTE — Telephone Encounter (Signed)
Patient was instructed to stop the amoxicillin as this reactions represents an allergy to PCN.

## 2011-03-02 ENCOUNTER — Ambulatory Visit: Payer: 59 | Admitting: Internal Medicine

## 2011-11-21 ENCOUNTER — Encounter: Payer: Self-pay | Admitting: Internal Medicine

## 2011-11-21 ENCOUNTER — Ambulatory Visit (INDEPENDENT_AMBULATORY_CARE_PROVIDER_SITE_OTHER): Payer: 59 | Admitting: Internal Medicine

## 2011-11-21 VITALS — BP 104/60 | HR 93 | Temp 98.4°F | Ht 65.5 in | Wt 136.5 lb

## 2011-11-21 DIAGNOSIS — R0981 Nasal congestion: Secondary | ICD-10-CM | POA: Insufficient documentation

## 2011-11-21 DIAGNOSIS — J3489 Other specified disorders of nose and nasal sinuses: Secondary | ICD-10-CM

## 2011-11-21 MED ORDER — AMOXICILLIN-POT CLAVULANATE 875-125 MG PO TABS: 1.0000 | ORAL_TABLET | Freq: Two times a day (BID) | ORAL | Status: DC

## 2011-11-21 MED ORDER — PREDNISONE (PAK) 10 MG PO TABS: ORAL_TABLET | ORAL | Status: DC

## 2011-11-21 MED ORDER — MONTELUKAST SODIUM 10 MG PO TABS: 10.0000 mg | ORAL_TABLET | Freq: Every day | ORAL | Status: DC

## 2011-11-21 NOTE — Progress Notes (Signed)
Patient ID: VYLA PINT, female   DOB: 1960/01/13, 51 y.o.   MRN: [id number]  Patient Active Problem List  Diagnosis  . Wears glasses/contacts  . Osteopenia  . Abnormal Pap smear of cervix  . Endometriosis of myometrium  . Chronic headaches  . Mucocele of appendix  . Sinus congestion    Subjective:  CC:   Chief Complaint  Patient presents with  . Sinus Problem    HPI:   Carmen Wu a 51 y.o. female who presents Sinus congestion with facial pain,  Ears painful and popping,  Using saline spray without good flushing.  Symptoms started a few weeks ago .  Started  with allergies . Taking zyrtec D every other day because it makes her too dried out.    Past Medical History  Diagnosis Date  . Allergy     now managed with daily Quercetin with bromelade  . Endometriosis of myometrium 1992  . Chronic headaches     Past Surgical History  Procedure Date  . Tonsillectomy and adenoidectomy 1966  . Wrist surgery 1999 (left ) & 2003 (right)  . Laparoscopic endometriosis fulguration 1992    Confirm procedure type with patient.  . Endometrial ablation 2006  . Mouth surgery 2000    Confirm type of surgery with patient.  . Sublingual cyst excision 1968  . Appendectomy 2011  . Colon surgery 04/14/2010    Segmental resection, portion of cecum and appendix base.         The following portions of the patient's history were reviewed and updated as appropriate: Allergies, current medications, and problem list.    Review of Systems:   12 Pt  review of systems was negative except those addressed in the HPI,     History   Social History  . Marital Status: Married    Spouse Name: N/A    Number of Children: N/A  . Years of Education: N/A   Occupational History  . Not on file.   Social History Main Topics  . Smoking status: Never Smoker   . Smokeless tobacco: Never Used  . Alcohol Use: Yes     Comment: Wine with dinner - 2 per day  . Drug Use: No  . Sexually  Active: Not on file   Other Topics Concern  . Not on file   Social History Narrative  . No narrative on file    Objective:  BP 104/60  Pulse 93  Temp 98.4 F (36.9 C) (Oral)  Ht 5' 5.5" (1.664 m)  Wt 136 lb 8 oz (61.916 kg)  BMI 22.37 kg/m2  SpO2 96%  General appearance: alert, cooperative and appears stated age Ears: normal TM's and external ear canals both ears Throat: lips, mucosa, and tongue normal; teeth and gums normal Neck: no adenopathy, no carotid bruit, supple, symmetrical, trachea midline and thyroid not enlarged, symmetric, no tenderness/mass/nodules Back: symmetric, no curvature. ROM normal. No CVA tenderness. Lungs: clear to auscultation bilaterally Heart: regular rate and rhythm, S1, S2 normal, no murmur, click, rub or gallop Abdomen: soft, non-tender; bowel sounds normal; no masses,  no organomegaly Pulses: 2+ and symmetric Skin: Skin color, texture, turgor normal. No rashes or lesions Lymph nodes: Cervical, supraclavicular, and axillary nodes normal.  Assessment and Plan:  Sinus congestion No evidence of bacterial sinusitis at this point.  Supportive care defined,  abx rx for future use if symptoms progress.   Updated Medication List Outpatient Encounter Prescriptions as of 11/21/2011  Medication Sig Dispense Refill  .  BIOTIN 5000 PO Take 1 tablet by mouth daily.        . calcium-vitamin D 250-100 MG-UNIT per tablet Take 1 tablet by mouth 2 (two) times daily. Ask patient to clarify dosage.       . drospirenone-ethinyl estradiol (YAZ) 3-0.02 MG per tablet Take 1 tablet by mouth daily. Ask patient to clarify dosage.       . minoxidil (ROGAINE) 2 % external solution Apply topically 2 (two) times daily.        . Multiple Vitamin (MULTIVITAMIN) capsule Take 1 capsule by mouth daily. Ask patient to clarify dosage. Also ask patient about herbal supplement Quercetin per medical history form. Not listed in database.       . polyethylene glycol (MIRALAX /  GLYCOLAX) packet Take 17 g by mouth daily.        Marland Kitchen QUERCETIN PO Take 1 tablet by mouth daily.        Marland Kitchen tretinoin (RETIN-A) 0.025 % cream Apply topically once a week.        Marland Kitchen amoxicillin-clavulanate (AUGMENTIN) 875-125 MG per tablet Take 1 tablet by mouth 2 (two) times daily.  14 tablet  0  . montelukast (SINGULAIR) 10 MG tablet Take 1 tablet (10 mg total) by mouth at bedtime.  30 tablet  3  . predniSONE (STERAPRED UNI-PAK) 10 MG tablet 6 tablets on Day 1 , then reduce by 1 tablet daily until gone  21 tablet  0  . [DISCONTINUED] predniSONE (STERAPRED UNI-PAK) 10 MG tablet 6 tablets on Day 1 , then reduce by 1 tablet daily until gone  21 tablet  0     No orders of the defined types were placed in this encounter.    No Follow-up on file.

## 2011-11-21 NOTE — Patient Instructions (Addendum)
You have sinus congestion and fluid behind the ears but no signs of bacterial infection currently,.  Condition is aggravated by allergies,.   Lavage your sinuses twice daily with Simply saline nasal spray.  UseSudafed PE 10 to 30 mg every 6  hours to manage the congestion.  Gargle with salt water often for the sore throat.  If you develop ear pain,    A temp  T > 100.4,  Greenor bloody nasal discharge,  Or facial pain, start the prednisone and an antibiotic.  Singulair as an alternative to use daily for allergies

## 2011-11-21 NOTE — Assessment & Plan Note (Signed)
No evidence of bacterial sinusitis at this point.  Supportive care defined,  abx rx for future use if symptoms progress.

## 2011-12-31 IMAGING — CT CT ABD-PELV W/ CM
2 of 5 series · 17 of 46 positions shown, 19 images · IV contrast (READICAT/WATER & [ID] OMNI 300)
Comparison: 04/13/2010.

CLINICAL DATA: Abdominal pain with history of appendectomy and
endometriosis.

CT ABDOMEN AND PELVIS WITH CONTRAST
TECHNIQUE: Multidetector CT imaging of the abdomen and pelvis was
performed following the standard protocol during bolus
administration of intravenous contrast.
Contrast: 100mL OMNIPAQUE IOHEXOL 300 MG/ML IV SOLN

[Series 2: abd/pelvis with · axial · 0.70mm/px · z∈[-336,+9]mm · 14 of 78 slices shown, 16 images]
[im 5/78  soft-tissue]
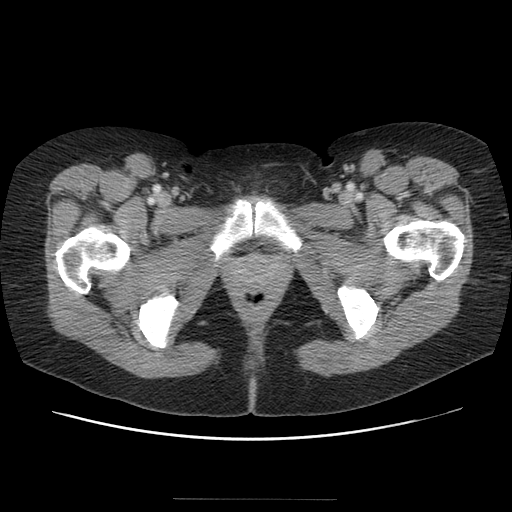
[im 5/78  bone]
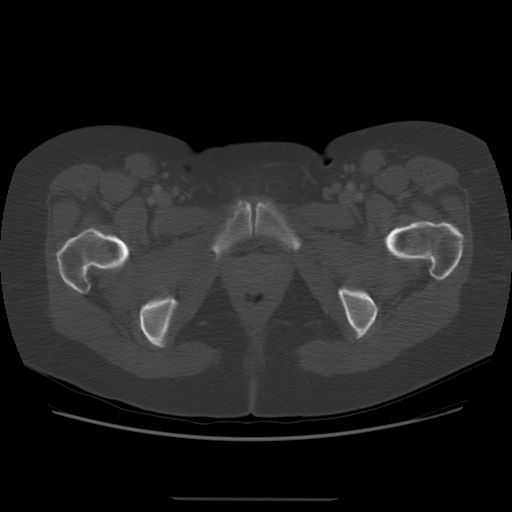
[im 9/78  soft-tissue]
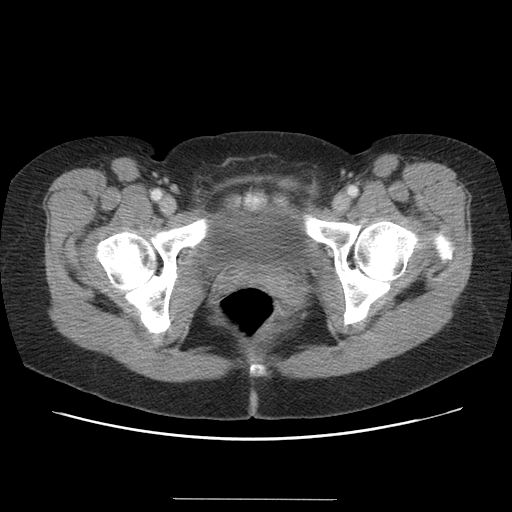
[im 18/78  soft-tissue]
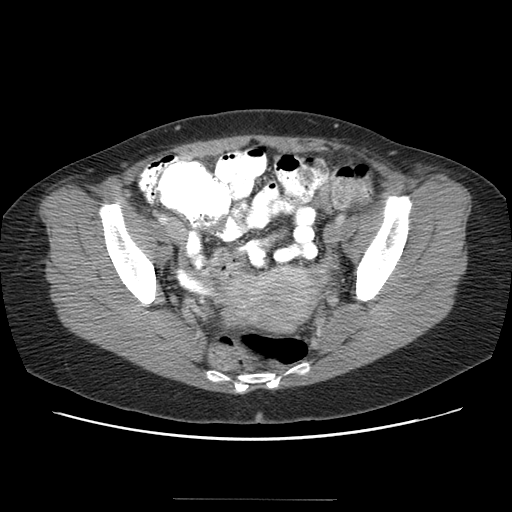
[im 22/78  soft-tissue]
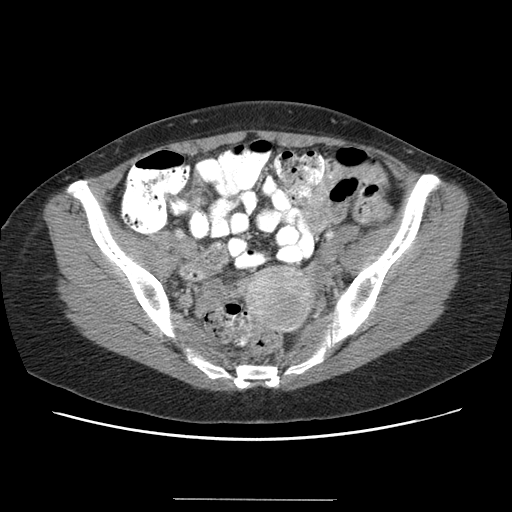
[im 26/78  soft-tissue]
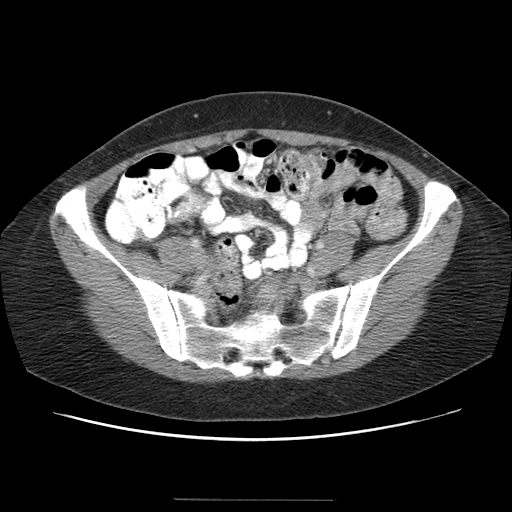
[im 30/78  soft-tissue]
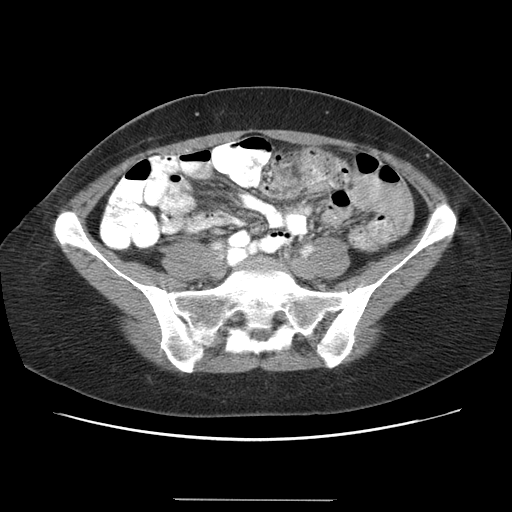
[im 35/78  soft-tissue]
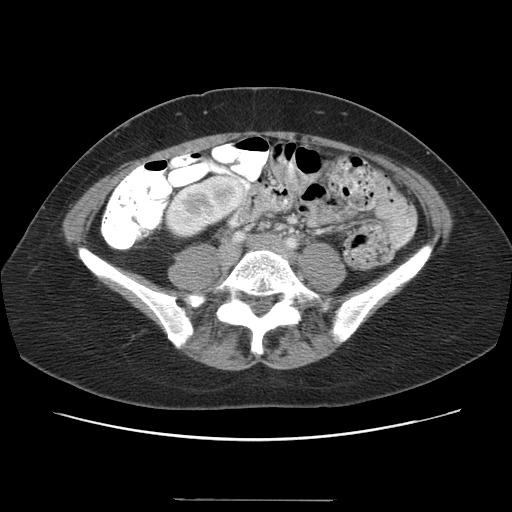
[im 43/78  soft-tissue]
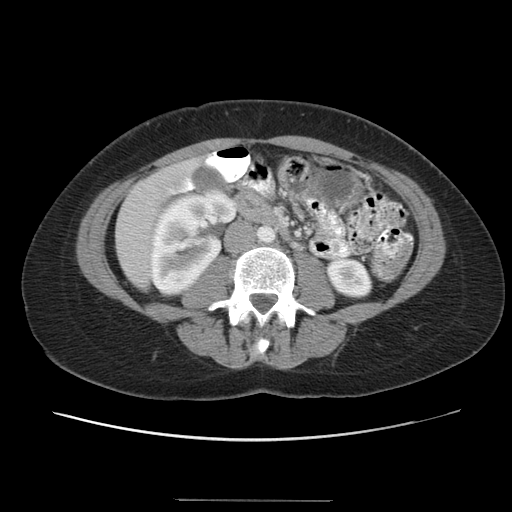
[im 48/78  soft-tissue]
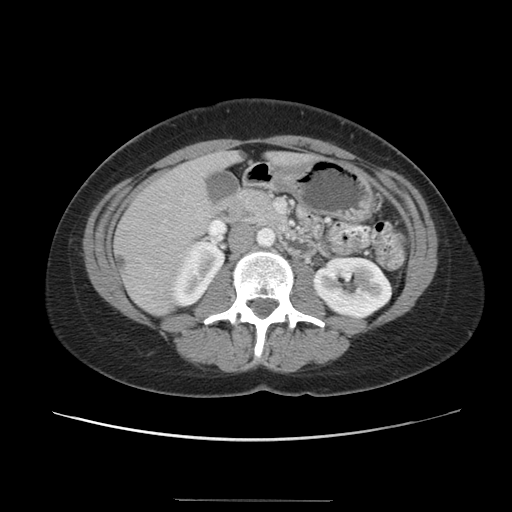
[im 48/78  bone]
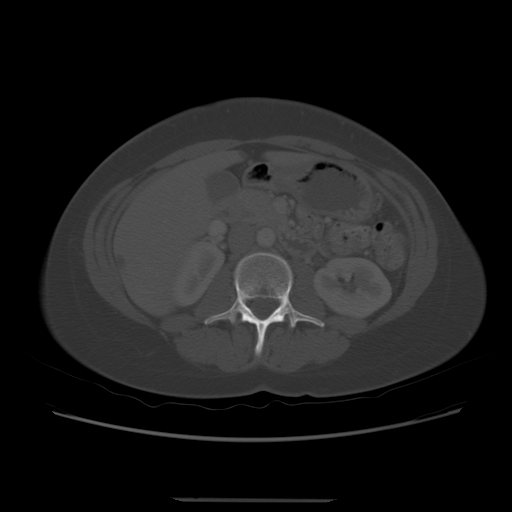
[im 52/78  soft-tissue]
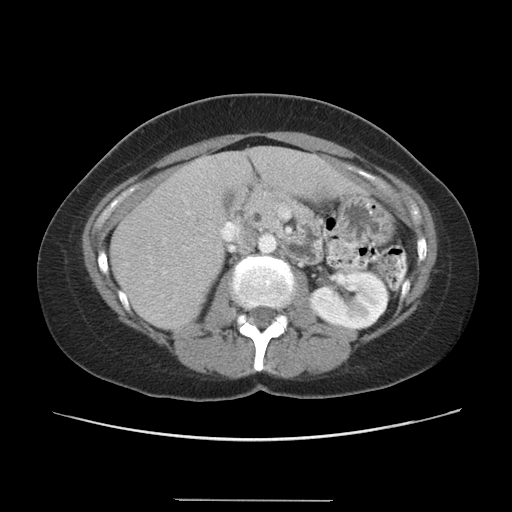
[im 56/78  soft-tissue]
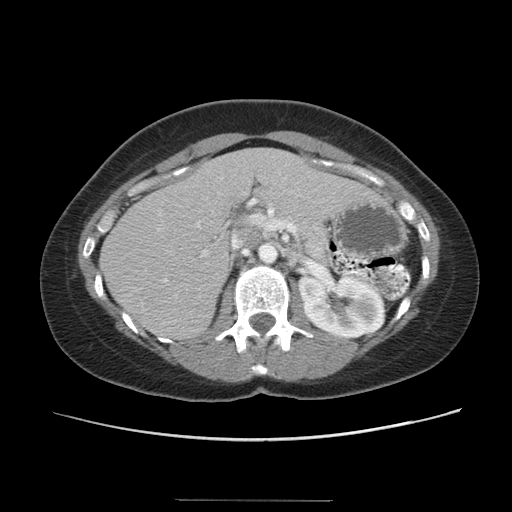
[im 60/78  soft-tissue]
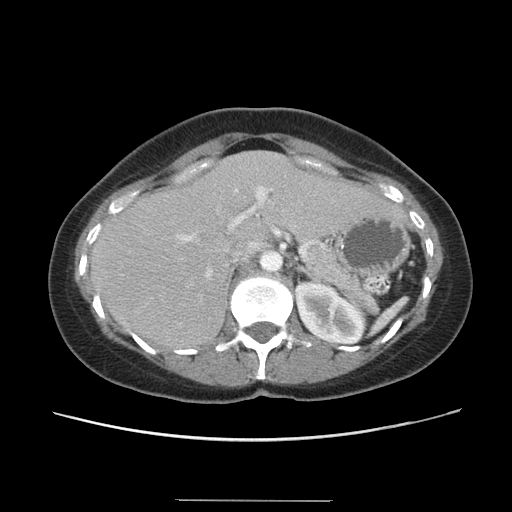
[im 69/78  soft-tissue]
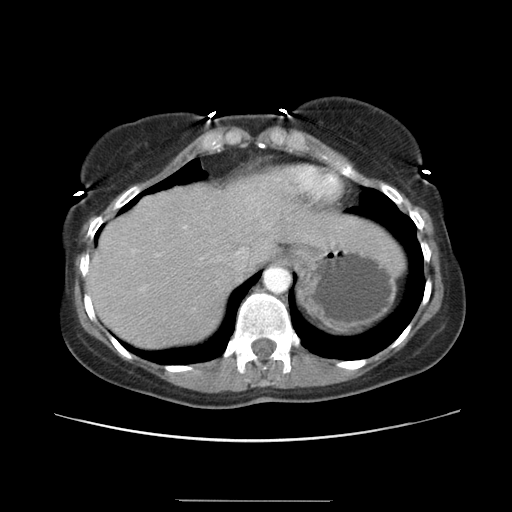
[im 73/78  soft-tissue]
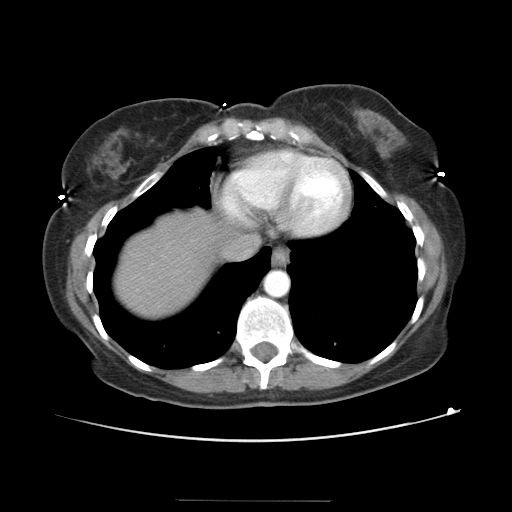

[Series 400: cor · coronal · 0.87mm/px · 3 of 104 slices shown]
[im 35/104  soft-tissue]
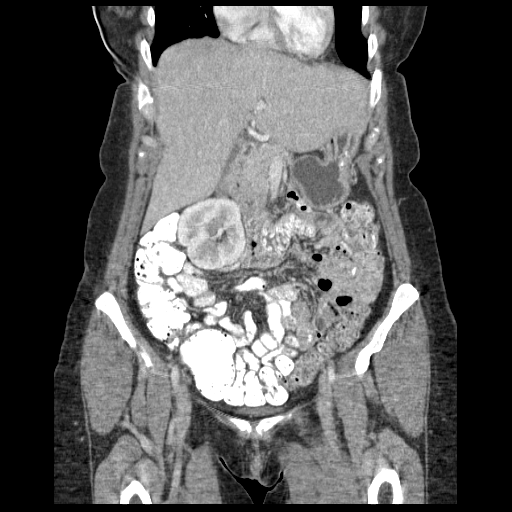
[im 46/104  soft-tissue]
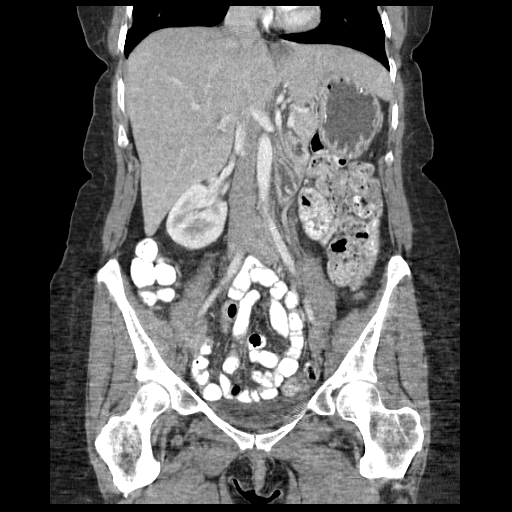
[im 58/104  soft-tissue]
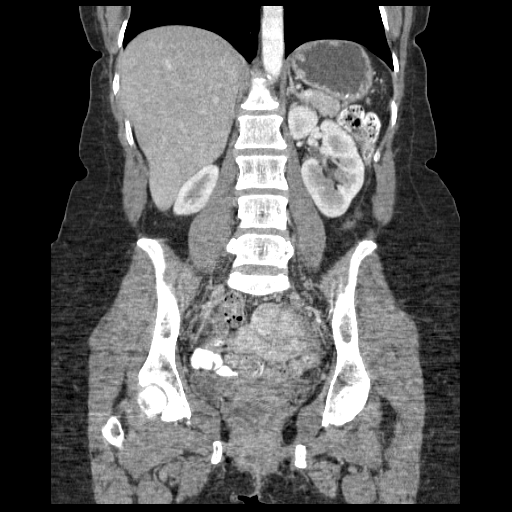

[17 of 46 positions shown; findings below may reference images not displayed]

FINDINGS: Lung bases show mild dependent atelectasis bilaterally.
Heart size normal.  No pericardial or pleural effusion.

An 11 mm low attenuation lesion in the peripheral right hepatic
lobe is unchanged and is likely a small cyst or hemangioma.  Liver,
gallbladder and adrenal glands are otherwise unremarkable.  Sub
centimeter low attenuation lesions in the kidneys are stable and
likely cysts.  Spleen, pancreas, stomach and small bowel are
unremarkable.  A fair amount of stool is seen in the colon.
Heterogeneous low attenuation lesions in the uterus measure up to
4.6 cm and are likely fibroids.

No pathologically enlarged lymph nodes.  No free fluid.  No
worrisome lytic or sclerotic lesions.
IMPRESSION: 1.  No acute findings in the abdomen or pelvis.
2.  Question constipation.
3.  Uterine fibroids.

## 2012-01-27 ENCOUNTER — Encounter: Payer: Self-pay | Admitting: Internal Medicine

## 2012-01-27 ENCOUNTER — Ambulatory Visit (INDEPENDENT_AMBULATORY_CARE_PROVIDER_SITE_OTHER): Payer: 59 | Admitting: Internal Medicine

## 2012-01-27 ENCOUNTER — Ambulatory Visit (INDEPENDENT_AMBULATORY_CARE_PROVIDER_SITE_OTHER)
Admission: RE | Admit: 2012-01-27 | Discharge: 2012-01-27 | Disposition: A | Discharge status: Home / Self Care | Payer: 59 | Source: Ambulatory Visit | Attending: Internal Medicine | Admitting: Internal Medicine

## 2012-01-27 VITALS — BP 108/62 | HR 88 | Temp 98.0°F | Resp 16 | Wt 139.5 lb

## 2012-01-27 DIAGNOSIS — S92919A Unspecified fracture of unspecified toe(s), initial encounter for closed fracture: Secondary | ICD-10-CM

## 2012-01-27 DIAGNOSIS — R0981 Nasal congestion: Secondary | ICD-10-CM

## 2012-01-27 DIAGNOSIS — M79671 Pain in right foot: Secondary | ICD-10-CM

## 2012-01-27 DIAGNOSIS — M79609 Pain in unspecified limb: Secondary | ICD-10-CM

## 2012-01-27 DIAGNOSIS — J3489 Other specified disorders of nose and nasal sinuses: Secondary | ICD-10-CM

## 2012-01-27 MED ORDER — HYDROCODONE-ACETAMINOPHEN 5-325 MG PO TABS: 1.0000 | ORAL_TABLET | Freq: Four times a day (QID) | ORAL | Status: DC | PRN

## 2012-01-27 NOTE — Progress Notes (Signed)
Patient ID: Carmen Wu, female   DOB: 11-Jan-1960, 52 y.o.   MRN: [id number]  Patient Active Problem List  Diagnosis  . Wears glasses/contacts  . Osteopenia  . Abnormal Pap smear of cervix  . Endometriosis of myometrium  . Chronic headaches  . Mucocele of appendix  . Sinus congestion  . Phalanx fracture, foot    Subjective:  CC:   Chief Complaint  Patient presents with  . toe    Tinks it is fractured    HPI:   Carmen Wu a 52 y.o. female who presents with right foot foot pain.  She has had pain under the 4th toe on her right foot for 4 weeks following an injury.  She stubbed her foot on a box while walking. Initially she has pain and swellling.  xome bruising.  Pain improved and she has been weight bearing with mild discomfort but had a sudden increase in pain this week now with pain that radiates up the dorsum of her foot to the mid foot area, accompanied by swelling and bruising .     Past Medical History  Diagnosis Date  . Allergy     now managed with daily Quercetin with bromelade  . Endometriosis of myometrium 1992  . Chronic headaches     Past Surgical History  Procedure Date  . Tonsillectomy and adenoidectomy 1966  . Wrist surgery 1999 (left ) & 2003 (right)  . Laparoscopic endometriosis fulguration 1992    Confirm procedure type with patient.  . Endometrial ablation 2006  . Mouth surgery 2000    Confirm type of surgery with patient.  . Sublingual cyst excision 1968  . Appendectomy 2011  . Colon surgery 04/14/2010    Segmental resection, portion of cecum and appendix base.         The following portions of the patient's history were reviewed and updated as appropriate: Allergies, current medications, and problem list.    Review of Systems:   12 Pt  review of systems was negative except those addressed in the HPI,     History   Social History  . Marital Status: Married    Spouse Name: N/A    Number of Children: N/A  . Years of  Education: N/A   Occupational History  . Not on file.   Social History Main Topics  . Smoking status: Never Smoker   . Smokeless tobacco: Never Used  . Alcohol Use: Yes     Comment: Wine with dinner - 2 per day  . Drug Use: No  . Sexually Active: Not on file   Other Topics Concern  . Not on file   Social History Narrative  . No narrative on file    Objective:  BP 108/62  Pulse 88  Temp 98 F (36.7 C) (Oral)  Resp 16  Wt 139 lb 8 oz (63.277 kg)  SpO2 98%  General appearance: alert, cooperative and appears stated age Lungs: clear to auscultation bilaterally Heart: regular rate and rhythm, S1, S2 normal, no murmur, click, rub or gallop Abdomen: soft, non-tender; bowel sounds normal; no masses,  no organomegaly Pulses: 2+ and symmetric Ext: rigth fain, tenderness aong 4th phalanx, aggravated by forced flexion of toe.. No bruising .  Some swelling of forefoot Skin: Skin color, texture, turgor normal. No rashes or lesions Lymph nodes: Cervical, supraclavicular, and axillary nodes normal.  Assessment and Plan:  Sinus congestion Symptoms are now resolved on current regimen  Phalanx fracture, foot 4th toe, suspected and  confirmed by radiographs.  Referral to podiatry  nonweight bearing recommended.    Updated Medication List Outpatient Encounter Prescriptions as of 01/27/2012  Medication Sig Dispense Refill  . BIOTIN 5000 PO Take 1 tablet by mouth daily.        . calcium-vitamin D 250-100 MG-UNIT per tablet Take 1 tablet by mouth 2 (two) times daily. Ask patient to clarify dosage.       . drospirenone-ethinyl estradiol (YAZ) 3-0.02 MG per tablet Take 1 tablet by mouth daily. Ask patient to clarify dosage.       . minoxidil (ROGAINE) 2 % external solution Apply topically 2 (two) times daily.        . montelukast (SINGULAIR) 10 MG tablet Take 1 tablet (10 mg total) by mouth at bedtime.  30 tablet  3  . Multiple Vitamin (MULTIVITAMIN) capsule Take 1 capsule by mouth daily.  Ask patient to clarify dosage. Also ask patient about herbal supplement Quercetin per medical history form. Not listed in database.       . polyethylene glycol (MIRALAX / GLYCOLAX) packet Take 17 g by mouth daily.        . predniSONE (STERAPRED UNI-PAK) 10 MG tablet 6 tablets on Day 1 , then reduce by 1 tablet daily until gone  21 tablet  0  . QUERCETIN PO Take 1 tablet by mouth daily.        Marland Kitchen tretinoin (RETIN-A) 0.025 % cream Apply topically once a week.        Marland Kitchen HYDROcodone-acetaminophen (NORCO/VICODIN) 5-325 MG per tablet Take 1 tablet by mouth every 6 (six) hours as needed for pain.  30 tablet  3  . [DISCONTINUED] amoxicillin-clavulanate (AUGMENTIN) 875-125 MG per tablet Take 1 tablet by mouth 2 (two) times daily.  14 tablet  0  . [DISCONTINUED] nitrofurantoin, macrocrystal-monohydrate, (MACROBID) 100 MG capsule          Orders Placed This Encounter  Procedures  . DG Foot Complete Right    No Follow-up on file.

## 2012-01-27 NOTE — Patient Instructions (Addendum)
continue aleve or ibuprofen ,  Stop the tylenol and use the vicodin for pain   Elevated,   Avoid weight bearing  Get your x rays done this afternoon at [facility].

## 2012-01-28 ENCOUNTER — Other Ambulatory Visit: Payer: Self-pay | Admitting: Internal Medicine

## 2012-01-28 DIAGNOSIS — S92919A Unspecified fracture of unspecified toe(s), initial encounter for closed fracture: Secondary | ICD-10-CM

## 2012-01-29 ENCOUNTER — Encounter: Payer: Self-pay | Admitting: Internal Medicine

## 2012-01-29 DIAGNOSIS — S92919A Unspecified fracture of unspecified toe(s), initial encounter for closed fracture: Secondary | ICD-10-CM | POA: Insufficient documentation

## 2012-01-29 NOTE — Assessment & Plan Note (Signed)
Symptoms are now resolved on current regimen

## 2012-01-29 NOTE — Assessment & Plan Note (Signed)
4th toe, suspected and confirmed by radiographs.  Referral to podiatry  nonweight bearing recommended.

## 2012-02-25 ENCOUNTER — Other Ambulatory Visit: Payer: Self-pay

## 2012-03-28 ENCOUNTER — Telehealth: Payer: Self-pay | Admitting: Internal Medicine

## 2012-03-28 DIAGNOSIS — M778 Other enthesopathies, not elsewhere classified: Secondary | ICD-10-CM

## 2012-03-28 NOTE — Telephone Encounter (Signed)
Will refer as requested but need more detail.  What body part is affected,  How long has it been a problem?

## 2012-03-28 NOTE — Telephone Encounter (Signed)
Pt states she thinks she is getting tendonitis again and is asking for referral.  Pt did not wish to make an appt with Dr. Darrick Huntsman at this time, only wants to see one doctor for this issue, and just wants a call back from the nurse.    Alternate # [id number]

## 2012-03-29 NOTE — Telephone Encounter (Signed)
LMOVM for pt to return call 

## 2012-04-04 ENCOUNTER — Telehealth: Payer: Self-pay | Admitting: Emergency Medicine

## 2012-04-04 ENCOUNTER — Encounter: Payer: Self-pay | Admitting: Emergency Medicine

## 2012-04-04 NOTE — Telephone Encounter (Signed)
Referral to [facility] under way

## 2012-04-04 NOTE — Telephone Encounter (Signed)
Pt called back stating that it was her RT elbow, has been hurting for a month primarily when she does small tasks such as using her blackberry.

## 2012-06-22 ENCOUNTER — Ambulatory Visit: Payer: 59 | Admitting: Adult Health

## 2012-10-09 ENCOUNTER — Ambulatory Visit (INDEPENDENT_AMBULATORY_CARE_PROVIDER_SITE_OTHER): Payer: 59 | Admitting: Adult Health

## 2012-10-09 ENCOUNTER — Encounter: Payer: Self-pay | Admitting: Adult Health

## 2012-10-09 VITALS — BP 102/70 | HR 74 | Temp 98.1°F | Resp 12 | Wt 140.5 lb

## 2012-10-09 DIAGNOSIS — M25559 Pain in unspecified hip: Secondary | ICD-10-CM

## 2012-10-09 DIAGNOSIS — M25551 Pain in right hip: Secondary | ICD-10-CM | POA: Insufficient documentation

## 2012-10-09 MED ORDER — CYCLOBENZAPRINE HCL 5 MG PO TABS: 5.0000 mg | ORAL_TABLET | Freq: Three times a day (TID) | ORAL | Status: DC | PRN

## 2012-10-09 NOTE — Patient Instructions (Addendum)
   Take ibuprofen 600 mg every 6 hours for the next 7 days. Take with food.  Also take flexeril for muscle spasms. This will make you sleepy so only take this at bedtime when you are working.  Apply ice alternating with heat to the affected areas for 15 min at a time. Do this approximately 3-4 times daily.  Use a firm pillow between your knees when you lie on your side or under your knees when you lie on your back.  You can try a small, thinner pillow on you pelvis when lying on your stomach.  If your symptoms are not improving within 2 weeks I will send you for hip xray and referral to Ortho.

## 2012-10-09 NOTE — Assessment & Plan Note (Signed)
Suspect bursitis. Start ibuprofen 600 mg q 6 hours with food x 7 days. Ice to the area. If no improvement in 2 weeks send for plain films of right hip and referral to Ortho.

## 2012-10-09 NOTE — Progress Notes (Signed)
  Subjective:    Patient ID: Carmen Wu, female    DOB: [birth year], 52 y.o.   MRN: [id number]  HPI  Patient presents with right hip/pelvic pain that started approximately 1 month ago. She reports being away on vacation with her family. She reports that the pain is worse with certain movements. For instance, when she goes from a sitting to a standing position she feels a stretch and a burn in the right hip. Patient reports that the pain does not really bother her at night other than when she is lying on her stomach. She denies feeling any masses or lumps in the area. She is not having any trouble walking.    Current Outpatient Prescriptions on File Prior to Visit  Medication Sig Dispense Refill  . calcium-vitamin D 250-100 MG-UNIT per tablet Take 1 tablet by mouth 2 (two) times daily. Ask patient to clarify dosage.       . minoxidil (ROGAINE) 2 % external solution Apply topically 2 (two) times daily.        . montelukast (SINGULAIR) 10 MG tablet Take 1 tablet (10 mg total) by mouth at bedtime.  30 tablet  3  . Multiple Vitamin (MULTIVITAMIN) capsule Take 1 capsule by mouth daily. Ask patient to clarify dosage. Also ask patient about herbal supplement Quercetin per medical history form. Not listed in database.       . polyethylene glycol (MIRALAX / GLYCOLAX) packet Take 17 g by mouth daily.        Marland Kitchen QUERCETIN PO Take 1 tablet by mouth daily.        Marland Kitchen tretinoin (RETIN-A) 0.025 % cream Apply topically once a week.         No current facility-administered medications on file prior to visit.     Review of Systems  Musculoskeletal: Negative for joint swelling.       Right hip pain       Objective:   Physical Exam  Constitutional: She is oriented to person, place, and time. She appears well-developed and well-nourished. No distress.  Musculoskeletal: Normal range of motion. She exhibits tenderness. She exhibits no edema.  Tenderness with palpation of the right hip trochanter. Worse with  flexion of the hip.  Neurological: She is alert and oriented to person, place, and time.  Skin: Skin is warm and dry.  Psychiatric: She has a normal mood and affect. Her behavior is normal. Judgment and thought content normal.          Assessment & Plan:

## 2012-11-15 ENCOUNTER — Other Ambulatory Visit: Payer: Self-pay

## 2013-01-07 ENCOUNTER — Telehealth: Payer: Self-pay | Admitting: Emergency Medicine

## 2013-01-21 ENCOUNTER — Ambulatory Visit: Payer: Self-pay | Admitting: Podiatry

## 2013-07-11 ENCOUNTER — Ambulatory Visit (INDEPENDENT_AMBULATORY_CARE_PROVIDER_SITE_OTHER): Payer: 59 | Admitting: Adult Health

## 2013-07-11 ENCOUNTER — Encounter: Payer: Self-pay | Admitting: Adult Health

## 2013-07-11 VITALS — BP 110/70 | HR 71 | Temp 98.0°F | Resp 14 | Ht 65.5 in | Wt 141.2 lb

## 2013-07-11 DIAGNOSIS — H9209 Otalgia, unspecified ear: Secondary | ICD-10-CM

## 2013-07-11 DIAGNOSIS — H9201 Otalgia, right ear: Secondary | ICD-10-CM

## 2013-07-11 NOTE — Patient Instructions (Signed)
  Antihistamines can cause excessive dryness in the sinuses also causing discomfort. Start saline spray and use as often as you like  Continue irrigating your sinuses as you have been doing.  Auralgan ear drops as directed for pain.  You can use tylenol, ibuprofen for pain or discomfort.  Try a decongestant short term (4-5 days) to see if this helps get your sinuses cleared.  You may also want to try Afrin nasal spray but not more than 3 days.  Call if no improvement within 1 week or sooner if symptoms worsen.

## 2013-07-11 NOTE — Progress Notes (Signed)
Pre visit review using our clinic review tool, if applicable. No additional management support is needed unless otherwise documented below in the visit note. 

## 2013-07-11 NOTE — Progress Notes (Signed)
Patient ID: Carmen Wu, female   DOB: [birth year], 53 y.o.   MRN: [id number]   Subjective:    Patient ID: Carmen Wu, female    DOB: [birth year], 52 y.o.   MRN: [id number]  HPI  Presents with right ear pain x 2 weeks. Feels sinus pressure and stuffiness on the right maxillary sinus. No fever, chills. Uses arm and hammer sinus rinse. Has been taking zyrtec.    Past Medical History  Diagnosis Date  . Allergy     now managed with daily Quercetin with bromelade  . Endometriosis of myometrium 1992  . Chronic headaches     Current Outpatient Prescriptions on File Prior to Visit  Medication Sig Dispense Refill  . calcium-vitamin D 250-100 MG-UNIT per tablet Take 1 tablet by mouth 2 (two) times daily. Ask patient to clarify dosage.       Marland Kitchen estradiol (MINIVELLE) 0.05 MG/24HR patch Place 1 patch onto the skin 2 (two) times a week.      . minoxidil (ROGAINE) 2 % external solution Apply topically 2 (two) times daily.        . Multiple Vitamin (MULTIVITAMIN) capsule Take 1 capsule by mouth daily. Ask patient to clarify dosage. Also ask patient about herbal supplement Quercetin per medical history form. Not listed in database.       . progesterone (PROMETRIUM) 200 MG capsule Take 200 mg by mouth. 12 days every 3rd month      . QUERCETIN PO Take 1 tablet by mouth daily.        Marland Kitchen tretinoin (RETIN-A) 0.025 % cream Apply topically once a week.         No current facility-administered medications on file prior to visit.     Review of Systems  Constitutional: Negative for fever and chills.  HENT: Positive for congestion (right side maxillary sinus) and sinus pressure. Negative for postnasal drip, rhinorrhea, sneezing and sore throat.   Respiratory: Negative for cough, shortness of breath and wheezing.   All other systems reviewed and are negative.      Objective:  BP 110/70  Pulse 71  Temp(Src) 98 F (36.7 C) (Oral)  Resp 14  Ht 5' 5.5" (1.664 m)  Wt 141 lb 4 oz (64.071 kg)  BMI  23.14 kg/m2  SpO2 98%   Physical Exam  Constitutional: She is oriented to person, place, and time. She appears well-developed and well-nourished. No distress.  HENT:  Head: Normocephalic and atraumatic.  Left Ear: External ear normal.  Mouth/Throat: No oropharyngeal exudate.  Fluid noted right ear. No s/s of infection  Cardiovascular: Normal rate, regular rhythm and normal heart sounds.  Exam reveals no gallop.   No murmur heard. Pulmonary/Chest: Effort normal and breath sounds normal. No respiratory distress. She has no wheezes. She has no rales.  Lymphadenopathy:    She has cervical adenopathy.  Neurological: She is alert and oriented to person, place, and time.  Psychiatric: She has a normal mood and affect. Her behavior is normal. Judgment and thought content normal.      Assessment & Plan:   1. Ear pain, right Auralgan for pain. Suggested trying decongestant, nasal saline spray liberally, afrin x 3 days, continue irrigation, tylenol or ibuprofen for pain or discomfort. RTC if no improvement within 1 week or sooner if necessary.

## 2014-03-24 ENCOUNTER — Other Ambulatory Visit: Payer: Self-pay

## 2014-03-25 LAB — CYTOLOGY - PAP

## 2014-07-15 ENCOUNTER — Telehealth: Payer: Self-pay

## 2014-07-15 NOTE — Telephone Encounter (Signed)
LMTCB about scheduling a mammogram

## 2014-12-26 ENCOUNTER — Ambulatory Visit (INDEPENDENT_AMBULATORY_CARE_PROVIDER_SITE_OTHER): Payer: 59 | Admitting: Nurse Practitioner

## 2014-12-26 ENCOUNTER — Encounter: Payer: Self-pay | Admitting: Nurse Practitioner

## 2014-12-26 VITALS — BP 112/68 | HR 65 | Temp 97.7°F | Ht 65.5 in | Wt 138.8 lb

## 2014-12-26 DIAGNOSIS — R0981 Nasal congestion: Secondary | ICD-10-CM | POA: Diagnosis not present

## 2014-12-26 MED ORDER — PREDNISONE 10 MG PO TABS: ORAL_TABLET | ORAL | Status: AC

## 2014-12-26 NOTE — Patient Instructions (Signed)
Prednisone with breakfast or lunch at the latest.  6 tablets on day 1, 5 tablets on day 2, 4 tablets on day 3, 3 tablets on day 4, 2 tablets day 5, 1 tablet on day 6...done! Take tablets all together not spaced out Don't take with NSAIDs (Ibuprofen, Aleve, Naproxen, Meloxicam ect...).

## 2014-12-26 NOTE — Assessment & Plan Note (Signed)
Uncontrolled x 3 weeks. Discussion of treatment we settled on a prednisone taper. Instructions given. Pt declined a abx script in case it does not improve stating "I want to be optimistic". Asked her to call next week if no improvement.

## 2014-12-26 NOTE — Progress Notes (Signed)
Pre visit review using our clinic review tool, if applicable. No additional management support is needed unless otherwise documented below in the visit note. 

## 2014-12-26 NOTE — Progress Notes (Signed)
Patient ID: Carmen Wu, female    DOB: 06/08/1960  Age: 54 y.o. MRN: [id number]  CC: No chief complaint on file.   HPI Carmen Wu presents for CC of 1 month of congestion.   1) Pt report 3-4 weeks of symptoms. Congestion, Ear R>L stuffiness, cough, fatigue. 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 Nasal congestion and stuffiness the most persistent issue she reports.    Treatment to date:   Tylenol sinus  Zyrtec  Sudafed PE   Sick contacts: Denies     History Carmen Wu has a past medical history of Allergy; Endometriosis of myometrium (1992); and Chronic headaches.   She has past surgical history that includes Tonsillectomy and adenoidectomy (1966); Wrist surgery (1999 (left ) & 2003 (right)); Laparoscopic endometriosis fulguration (1992); Endometrial ablation (2006); Mouth surgery (2000); Sublingual cyst excision (1968); Appendectomy (2011); and Colon surgery (04/14/2010).   Her family history includes Diabetes in her mother; Endometriosis in her sister; Mental illness (age of onset: 27) in her father.She reports that she has never smoked. She has never used smokeless tobacco. She reports that she drinks alcohol. She reports that she does not use illicit drugs.  Outpatient Prescriptions Prior to Visit  Medication Sig Dispense Refill  . calcium-vitamin D 250-100 MG-UNIT per tablet Take 1 tablet by mouth 2 (two) times daily. Ask patient to clarify dosage.     Marland Kitchen estradiol (MINIVELLE) 0.05 MG/24HR patch Place 1 patch onto the skin 2 (two) times a week.    . minoxidil (ROGAINE) 2 % external solution Apply topically 2 (two) times daily.      . Multiple Vitamin (MULTIVITAMIN) capsule Take 1 capsule by mouth daily. Ask patient to clarify dosage. Also ask patient about herbal supplement Quercetin per medical history form. Not listed in database.     . progesterone (PROMETRIUM) 200 MG capsule Take 200 mg by mouth. 12 days every 3rd month    . QUERCETIN PO Take 1  tablet by mouth daily.      Marland Kitchen tretinoin (RETIN-A) 0.025 % cream Apply topically once a week.       No facility-administered medications prior to visit.    ROS Review of Systems  Constitutional: Negative for fever, chills, diaphoresis and fatigue.  HENT: Positive for congestion, ear pain, postnasal drip, rhinorrhea, sinus pressure, sneezing and voice change. Negative for ear discharge, nosebleeds and sore throat.   Eyes: Negative for visual disturbance.  Respiratory: Negative for chest tightness, shortness of breath and wheezing.   Cardiovascular: Negative for chest pain, palpitations and leg swelling.  Gastrointestinal: Negative for nausea, vomiting and diarrhea.  Skin: Negative for rash.  Neurological: Negative for dizziness and light-headedness.    Objective:  BP 112/68 mmHg  Pulse 65  Temp(Src) 97.7 F (36.5 C) (Oral)  Ht 5' 5.5" (1.664 m)  Wt 138 lb 12 oz (62.937 kg)  BMI 22.73 kg/m2  SpO2 97%  Physical Exam  Constitutional: She is oriented to person, place, and time. She appears well-developed and well-nourished. No distress.  HENT:  Head: Normocephalic and atraumatic.  Right Ear: External ear normal.  Left Ear: External ear normal.  Mouth/Throat: No oropharyngeal exudate.  TM's clear bilaterally  Eyes: EOM are normal. Pupils are equal, round, and reactive to light. Right eye exhibits no discharge. Left eye exhibits no discharge. No scleral icterus.  Cardiovascular: Normal rate, regular rhythm and normal heart sounds.  Exam reveals no gallop and no friction rub.   No murmur heard.  Pulmonary/Chest: Effort normal and breath sounds normal. No respiratory distress. She has no wheezes. She has no rales. She exhibits no tenderness.  Neurological: She is alert and oriented to person, place, and time. No cranial nerve deficit. She exhibits normal muscle tone. Coordination normal.  Skin: Skin is warm and dry. No rash noted. She is not diaphoretic.  Psychiatric: She has a normal  mood and affect. Her behavior is normal. Judgment and thought content normal.   Assessment & Plan:   Diagnoses and all orders for this visit:  Sinus congestion  Other orders -     predniSONE (DELTASONE) 10 MG tablet; Take 6 tablets by mouth on day 1 with breakfast then decrease by 1 tablet each day until gone.   I am having Carmen Wu start on predniSONE. I am also having her maintain her multivitamin, calcium-vitamin D, QUERCETIN PO, tretinoin, minoxidil, estradiol, and progesterone.  Meds ordered this encounter  Medications  . predniSONE (DELTASONE) 10 MG tablet    Sig: Take 6 tablets by mouth on day 1 with breakfast then decrease by 1 tablet each day until gone.    Dispense:  21 tablet    Refill:  0    Order Specific Question:  Supervising Provider    Answer:  Crecencio Mc [[id number]]     Follow-up: Return if symptoms worsen or fail to improve.
